# Patient Record
Sex: Male | Born: 1970 | Race: Black or African American | Hispanic: No | Marital: Single | Smoking: Never smoker
Health system: Southern US, Community
[De-identification: ages and names within clinical notes are randomized; demographics above are authoritative.]

## PROBLEM LIST (undated history)

## (undated) HISTORY — PX: APPENDECTOMY: SHX54

---

## 2014-07-23 ENCOUNTER — Ambulatory Visit: Payer: Self-pay | Admitting: Psychiatry

## 2021-05-19 ENCOUNTER — Telehealth (HOSPITAL_BASED_OUTPATIENT_CLINIC_OR_DEPARTMENT_OTHER): Payer: Self-pay | Admitting: Emergency Medicine

## 2021-05-19 ENCOUNTER — Other Ambulatory Visit: Payer: Self-pay

## 2021-05-19 ENCOUNTER — Encounter (HOSPITAL_BASED_OUTPATIENT_CLINIC_OR_DEPARTMENT_OTHER): Payer: Self-pay | Admitting: Emergency Medicine

## 2021-05-19 ENCOUNTER — Emergency Department (HOSPITAL_BASED_OUTPATIENT_CLINIC_OR_DEPARTMENT_OTHER)
Admission: EM | Admit: 2021-05-19 | Discharge: 2021-05-19 | Disposition: A | Discharge status: Home / Self Care | Payer: 59 | Attending: Emergency Medicine | Admitting: Emergency Medicine

## 2021-05-19 ENCOUNTER — Emergency Department (HOSPITAL_BASED_OUTPATIENT_CLINIC_OR_DEPARTMENT_OTHER): Payer: 59

## 2021-05-19 DIAGNOSIS — N132 Hydronephrosis with renal and ureteral calculous obstruction: Secondary | ICD-10-CM | POA: Insufficient documentation

## 2021-05-19 DIAGNOSIS — R1031 Right lower quadrant pain: Secondary | ICD-10-CM | POA: Diagnosis present

## 2021-05-19 DIAGNOSIS — N201 Calculus of ureter: Secondary | ICD-10-CM

## 2021-05-19 LAB — URINALYSIS, ROUTINE W REFLEX MICROSCOPIC
Bilirubin Urine: NEGATIVE
Glucose, UA: NEGATIVE mg/dL
Ketones, ur: NEGATIVE mg/dL
Leukocytes,Ua: NEGATIVE
Nitrite: NEGATIVE
Protein, ur: NEGATIVE mg/dL
Specific Gravity, Urine: 1.025 (ref 1.005–1.030)
pH: 6 (ref 5.0–8.0)

## 2021-05-19 LAB — URINALYSIS, MICROSCOPIC (REFLEX): RBC / HPF: >50 RBC/hpf (ref 0–5)

## 2021-05-19 MED ORDER — KETOROLAC TROMETHAMINE 15 MG/ML IJ SOLN
15.0000 mg | Freq: Once | INTRAMUSCULAR | Status: AC
  Administered 2021-05-19: 15 mg via INTRAVENOUS
  Filled 2021-05-19: qty 1

## 2021-05-19 MED ORDER — FENTANYL CITRATE PF 50 MCG/ML IJ SOSY
100.0000 ug | PREFILLED_SYRINGE | Freq: Once | INTRAMUSCULAR | Status: AC
  Administered 2021-05-19: 100 ug via INTRAVENOUS
  Filled 2021-05-19: qty 2

## 2021-05-19 MED ORDER — ONDANSETRON HCL 4 MG/2ML IJ SOLN
4.0000 mg | Freq: Once | INTRAMUSCULAR | Status: AC
Start: 2021-05-19 — End: 2021-05-19
  Administered 2021-05-19: 4 mg via INTRAVENOUS
  Filled 2021-05-19: qty 2

## 2021-05-19 MED ORDER — OXYCODONE HCL 5 MG PO TABS: 5.0000 mg | ORAL_TABLET | ORAL | 0 refills | Status: AC | PRN

## 2021-05-19 MED ORDER — ONDANSETRON 8 MG PO TBDP: 8.0000 mg | ORAL_TABLET | Freq: Three times a day (TID) | ORAL | 1 refills | Status: AC | PRN

## 2021-05-19 MED ORDER — HYDROMORPHONE HCL 2 MG PO TABS: 2.0000 mg | ORAL_TABLET | ORAL | 0 refills | Status: DC | PRN

## 2021-05-19 NOTE — ED Provider Notes (Signed)
? ?[facility] ?Provider Note: Lowella Dell, MD, FACEP ? ?CSN: [id number] ?MRN: [id number] ?ARRIVAL: 05/19/21 at 0421 ?ROOM: [id number] ? ? ?CHIEF COMPLAINT  ?Abdominal Pain ? ? ?HISTORY OF PRESENT ILLNESS  ?05/19/21 4:39 AM ?Larry Farley is a 51 y.o. male who was awakened about 2 hours ago with right lower quadrant pain with some pain in the right flank as well.  He describes it as a dull aching pain.  He rates it as an 8 out of 10.  It is not worse with movement or palpation.  He has had no associated nausea, vomiting or diarrhea.  He has had no urinary symptoms.  He has no history of kidney stones. ? ? ?History reviewed. No pertinent past medical history. ? ?Past Surgical History:  ?Procedure Laterality Date  ? APPENDECTOMY    ? ? ?No family history on file. ? ?Social History  ? ?Tobacco Use  ? Smoking status: Never  ? Smokeless tobacco: Never  ?Substance Use Topics  ? Alcohol use: Never  ? Drug use: Never  ? ? ?Prior to Admission medications   ?Medication Sig Start Date End Date Taking? Authorizing Provider  ?HYDROmorphone (DILAUDID) 2 MG tablet Take 1 tablet (2 mg total) by mouth every 4 (four) hours as needed for severe pain. 05/19/21  Yes Sareen Randon, MD  ?ondansetron (ZOFRAN-ODT) 8 MG disintegrating tablet Take 1 tablet (8 mg total) by mouth every 8 (eight) hours as needed. 05/19/21  Yes Cailin Gebel, Jonny Ruiz, MD  ? ? ?Allergies ?Patient has no known allergies. ? ? ?REVIEW OF SYSTEMS  ?Negative except as noted here or in the History of Present Illness. ? ? ?PHYSICAL EXAMINATION  ?Initial Vital Signs ?Blood pressure (!) 141/84, pulse 86, temperature 98.2 ?F (36.8 ?C), temperature source Oral, resp. rate 16, height 6' (1.829 m), weight 129.3 kg, SpO2 97 %. ? ?Examination ?General: Well-developed, well-nourished male in no acute distress; appearance consistent with age of record ?HENT: normocephalic; atraumatic ?Eyes: Normal appearance ?Neck: supple ?Heart: regular rate and rhythm ?Lungs: clear to  auscultation bilaterally ?Abdomen: soft; nondistended; nontender; bowel sounds present ?GU: No CVA tenderness ?Extremities: No deformity; full range of motion; pulses normal ?Neurologic: Awake, alert and oriented; motor function intact in all extremities and symmetric; no facial droop ?Skin: Warm and dry ?Psychiatric: Normal mood and affect ? ? ?RESULTS  ?Summary of this visit's results, reviewed and interpreted by myself: ? ? EKG Interpretation ? ?Date/Time:    ?Ventricular Rate:    ?PR Interval:    ?QRS Duration:   ?QT Interval:    ?QTC Calculation:   ?R Axis:     ?Text Interpretation:   ?  ? ?  ? ?Laboratory Studies: ?Results for orders placed or performed during the hospital encounter of 05/19/21 (from the past 24 hour(s))  ?Urinalysis, Routine w reflex microscopic Urine, Clean Catch     Status: Abnormal  ? Collection Time: 05/19/21  4:45 AM  ?Result Value Ref Range  ? Color, Urine YELLOW YELLOW  ? APPearance HAZY (A) CLEAR  ? Specific Gravity, Urine 1.025 1.005 - 1.030  ? pH 6.0 5.0 - 8.0  ? Glucose, UA NEGATIVE NEGATIVE mg/dL  ? Hgb urine dipstick LARGE (A) NEGATIVE  ? Bilirubin Urine NEGATIVE NEGATIVE  ? Ketones, ur NEGATIVE NEGATIVE mg/dL  ? Protein, ur NEGATIVE NEGATIVE mg/dL  ? Nitrite NEGATIVE NEGATIVE  ? Leukocytes,Ua NEGATIVE NEGATIVE  ?Urinalysis, Microscopic (reflex)     Status: Abnormal  ? Collection Time: 05/19/21  4:45 AM  ?Result Value  Ref Range  ? RBC / HPF >50 0 - 5 RBC/hpf  ? WBC, UA 0-5 0 - 5 WBC/hpf  ? Bacteria, UA FEW (A) NONE SEEN  ? Squamous Epithelial / LPF 0-5 0 - 5  ? Mucus PRESENT   ? Hyaline Casts, UA PRESENT   ? ?Imaging Studies: ?CT Renal Stone Study ? ?Result Date: 05/19/2021 ?CLINICAL DATA:  51 year old male with right side flank and abdominal pain. History of appendectomy. EXAM: CT ABDOMEN AND PELVIS WITHOUT CONTRAST TECHNIQUE: Multidetector CT imaging of the abdomen and pelvis was performed following the standard protocol without IV contrast. RADIATION DOSE REDUCTION: This exam  was performed according to the departmental dose-optimization program which includes automated exposure control, adjustment of the mA and/or kV according to patient size and/or use of iterative reconstruction technique. COMPARISON:  None Available. FINDINGS: Lower chest: Negative. Hepatobiliary: Geographic hepatic steatosis. Sparing at the gallbladder fossa. Negative gallbladder. Pancreas: Negative. Spleen: Negative. Adrenals/Urinary Tract: Adrenal glands are within normal limits. Punctate left upper pole nephrolithiasis. Otherwise negative noncontrast left kidney and left ureter. Decompressed urinary bladder. Mild right hydronephrosis. Right nephrolithiasis, intrarenal calculi up to 4 mm. Right hydroureter with mild periureteral stranding continuing into the pelvis. Distal right ureter obstructing calculus on series 2, image 80 measures 3 mm and is round located within 1 cm of the right ureterovesical junction. Stomach/Bowel: Negative large bowel aside from mild retained stool. Mild flocculated material also in the terminal ileum, but no dilated small bowel. Mild fluid-filled stomach. Negative duodenum. No free air, free fluid, or mesenteric inflammation identified. Vascular/Lymphatic: Normal caliber abdominal aorta. No calcified atherosclerosis or lymphadenopathy identified. Reproductive: Evidence of prostate brachytherapy. Otherwise negative. Other: No pelvic free fluid. Musculoskeletal: Lumbar facet arthropathy with vacuum facet. No acute osseous abnormality identified. IMPRESSION: 1. Right side obstructive uropathy with distal right ureter obstructing 3 mm calculus within 1 cm of the UVJ. Underlying right greater than left nephrolithiasis. 2. Hepatic steatosis. 3. Evidence of prostate brachytherapy. Electronically Signed   By: Odessa Fleming M.D.   On: 05/19/2021 05:47   ? ?ED COURSE and MDM  ?Nursing notes, initial and subsequent vitals signs, including pulse oximetry, reviewed and interpreted by myself. ? ?Vitals:   ? 05/19/21 0436  ?BP: (!) 141/84  ?Pulse: 86  ?Resp: 16  ?Temp: 98.2 ?F (36.8 ?C)  ?TempSrc: Oral  ?SpO2: 97%  ?Weight: 129.3 kg  ?Height: 6' (1.829 m)  ? ?Medications  ?ondansetron (ZOFRAN) injection 4 mg (4 mg Intravenous Given 05/19/21 0458)  ?fentaNYL (SUBLIMAZE) injection 100 mcg (100 mcg Intravenous Given 05/19/21 0459)  ?ketorolac (TORADOL) 15 MG/ML injection 15 mg (15 mg Intravenous Given 05/19/21 0538)  ? ?5:27 AM ?CT scan reviewed by myself.  Patient appears to have a reader a ureteral stone just proximal to the right UVJ with some associated mild hydroureter.  This would account for the patient's hematuria as well.  His pain is improved after IV fentanyl. ? ?6:00 AM ?Further relief with IV Toradol.  Patient does have a urologist with whom he can follow-up should the stone not pass on its own. ? ? ?PROCEDURES  ?Procedures ? ? ?ED DIAGNOSES  ? ?  ICD-10-CM   ?1. Ureterolithiasis  N20.1   ?  ? ? ? ?  ?Paula Libra, MD ?05/19/21 6546 ? ?

## 2021-05-19 NOTE — Telephone Encounter (Signed)
Narcotic prescription sent to different pharmacy. ?

## 2021-05-19 NOTE — ED Triage Notes (Signed)
Pt awoke with right sided abd pain x 2 hours ago. Denies n/v/d ?

## 2023-04-25 IMAGING — CT CT RENAL STONE PROTOCOL
2 of 4 series · 16 of 46 positions shown, 18 images · non-contrast
Comparison: None Available.

CLINICAL DATA: 50-year-old male with right side flank and abdominal
pain. History of appendectomy.



[Series 2: axial st · axial · 0.98mm/px · z∈[+622,+1102]mm · 13 of 106 slices shown, 15 images]
[im 5/106  soft-tissue]
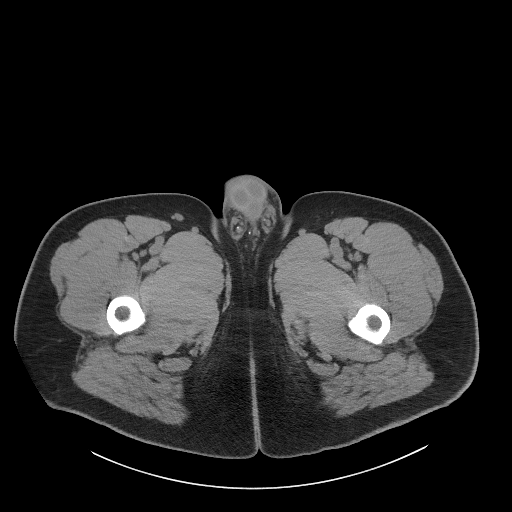
[im 5/106  bone]
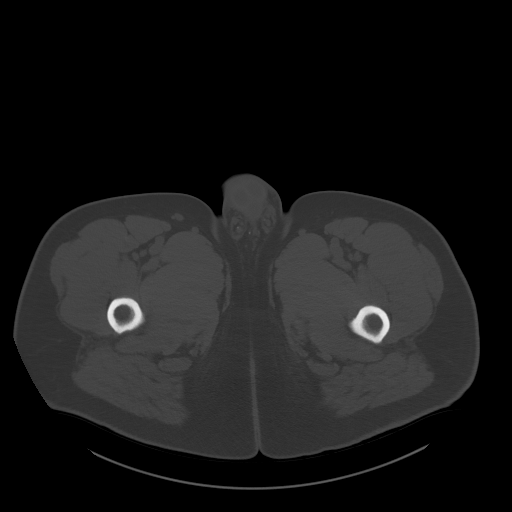
[im 13/106  soft-tissue]
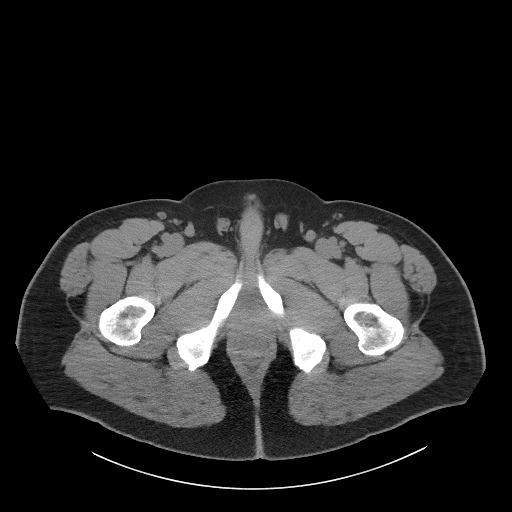
[im 22/106  soft-tissue]
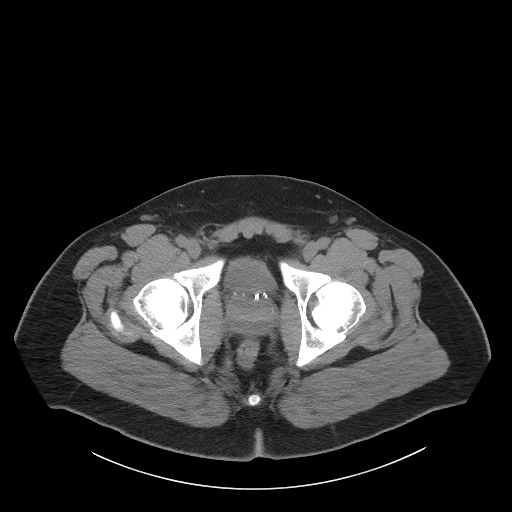
[im 30/106  soft-tissue]
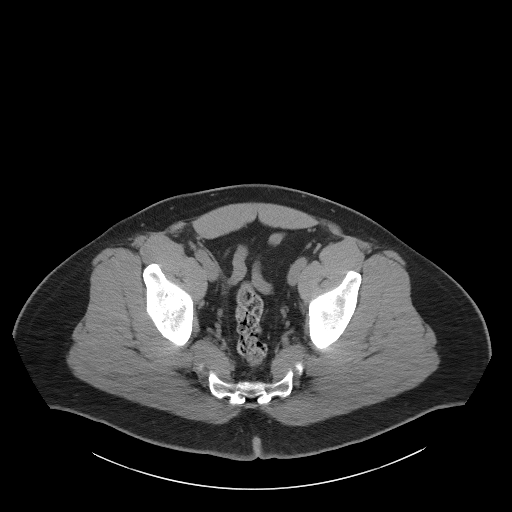
[im 38/106  soft-tissue]
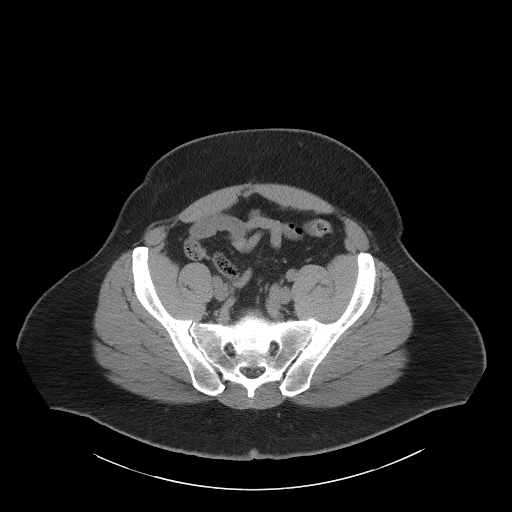
[im 47/106  soft-tissue]
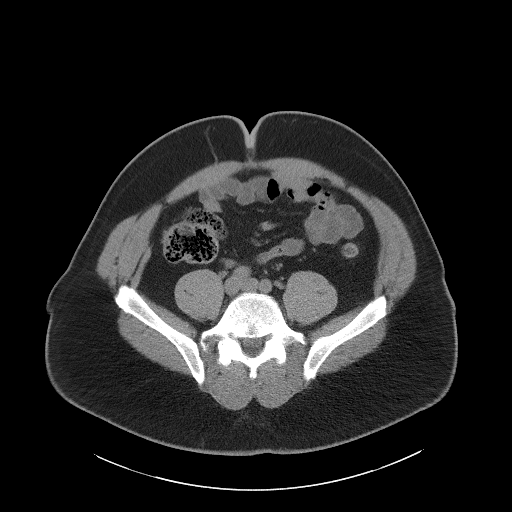
[im 55/106  soft-tissue]
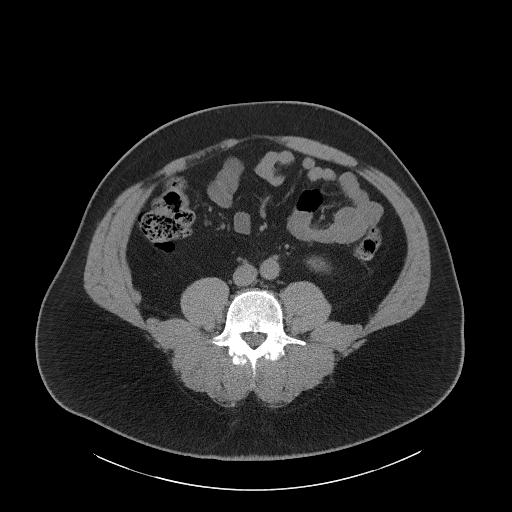
[im 59/106  soft-tissue]
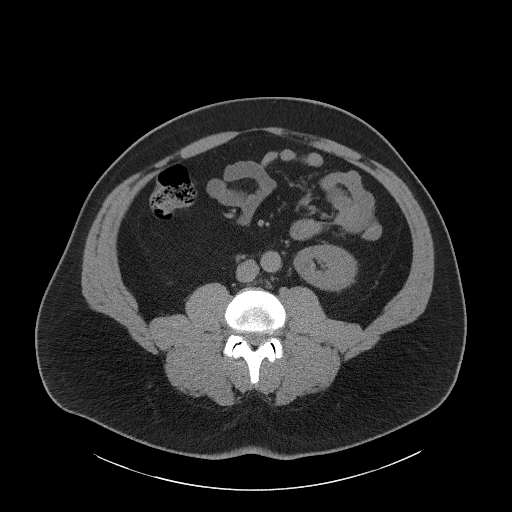
[im 68/106  soft-tissue]
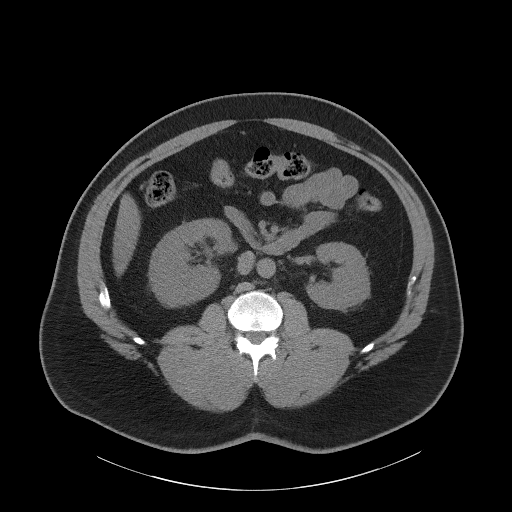
[im 68/106  bone]
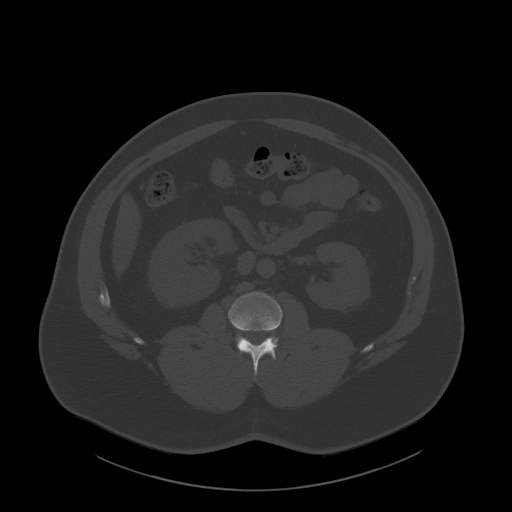
[im 76/106  soft-tissue]
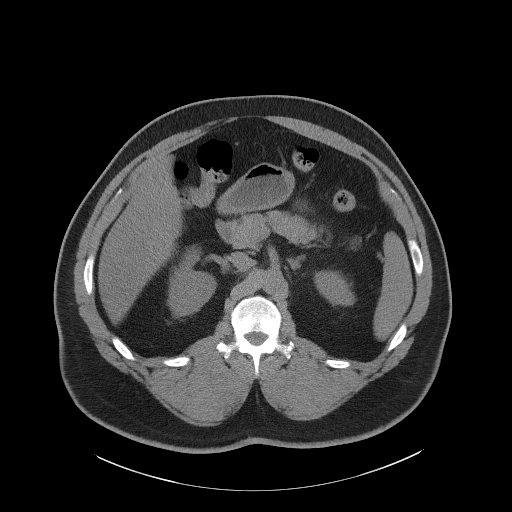
[im 85/106  soft-tissue]
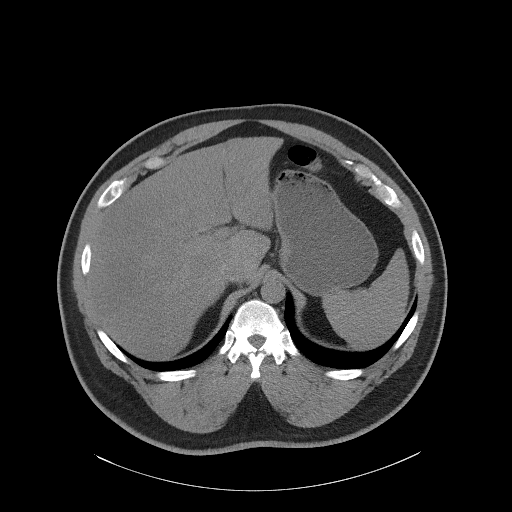
[im 93/106  soft-tissue]
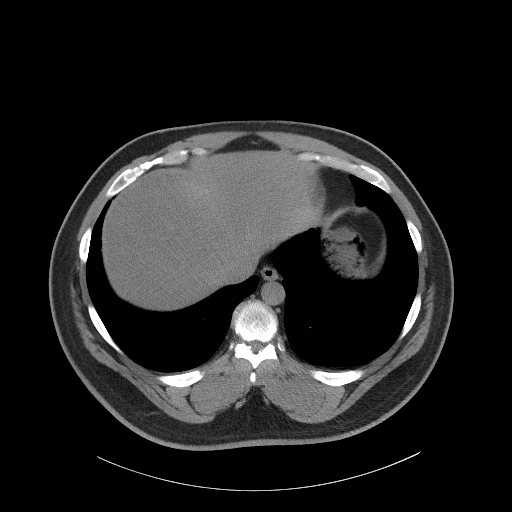
[im 101/106  soft-tissue]
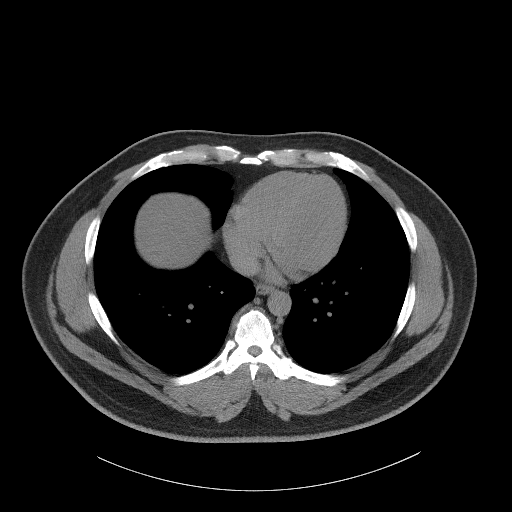

[Series 5: coronal st · coronal · 0.88mm/px · 3 of 109 slices shown]
[im 37/109  soft-tissue]
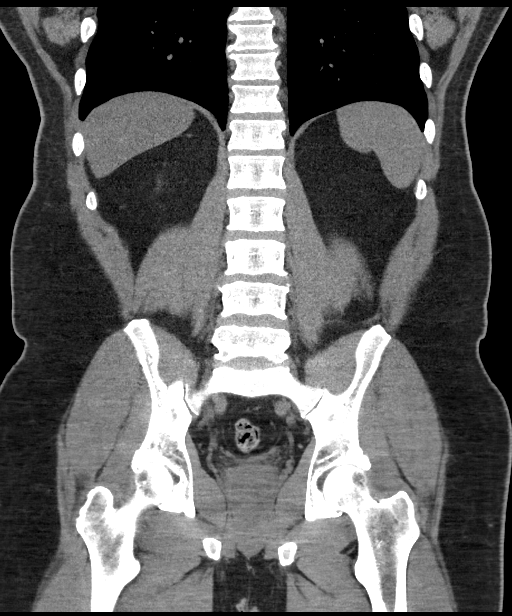
[im 49/109  soft-tissue]
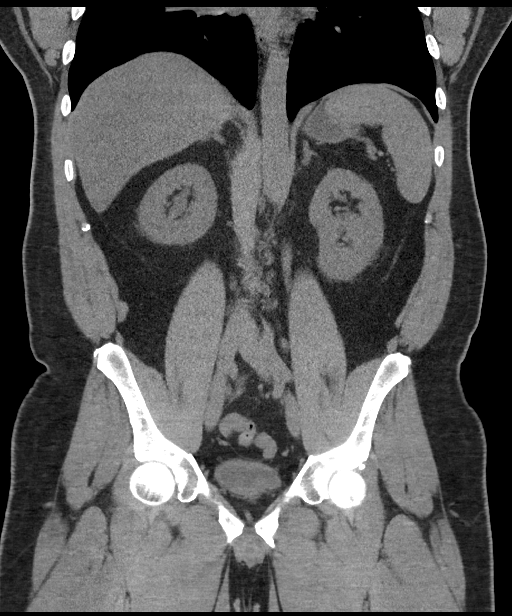
[im 61/109  soft-tissue]
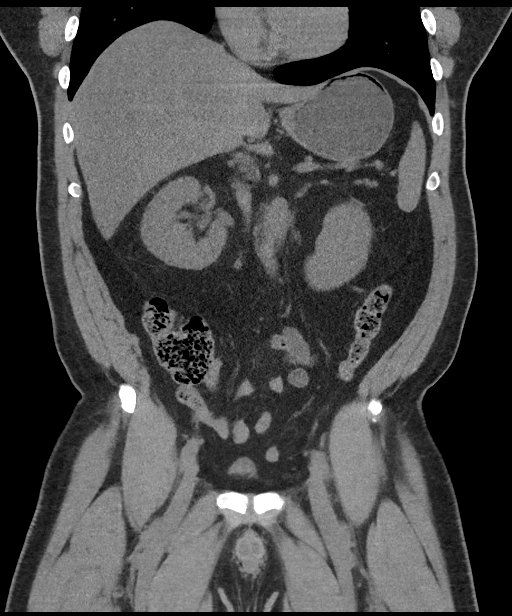

[16 of 46 positions shown; findings below may reference images not displayed]

FINDINGS: Lower chest: Negative.

Hepatobiliary: Geographic hepatic steatosis. Sparing at the
gallbladder fossa. Negative gallbladder.

Pancreas: Negative.

Spleen: Negative.

Adrenals/Urinary Tract: Adrenal glands are within normal limits.

Punctate left upper pole nephrolithiasis. Otherwise negative
noncontrast left kidney and left ureter. Decompressed urinary
bladder.

Mild right hydronephrosis. Right nephrolithiasis, intrarenal calculi
up to 4 mm. Right hydroureter with mild periureteral stranding
continuing into the pelvis. Distal right ureter obstructing calculus
on series 2, image 80 measures 3 mm and is round located within 1 cm
of the right ureterovesical junction.

Stomach/Bowel: Negative large bowel aside from mild retained stool.
Mild flocculated material also in the terminal ileum, but no dilated
small bowel. Mild fluid-filled stomach. Negative duodenum. No free
air, free fluid, or mesenteric inflammation identified.

Vascular/Lymphatic: Normal caliber abdominal aorta. No calcified
atherosclerosis or lymphadenopathy identified.

Reproductive: Evidence of prostate brachytherapy. Otherwise
negative.

Other: No pelvic free fluid.

Musculoskeletal: Lumbar facet arthropathy with vacuum facet. No
acute osseous abnormality identified.
IMPRESSION: 1. Right side obstructive uropathy with distal right ureter
obstructing 3 mm calculus within 1 cm of the UVJ. Underlying right
greater than left nephrolithiasis.
2. Hepatic steatosis.
3. Evidence of prostate brachytherapy.
